# Patient Record
Sex: Female | Born: 1975 | Race: White | Hispanic: No | Marital: Single
Health system: Southern US, Community
[De-identification: ages and names within clinical notes are randomized; demographics above are authoritative.]

---

## 2009-05-05 ENCOUNTER — Ambulatory Visit: Payer: Self-pay | Admitting: Infectious Disease

## 2009-05-05 DIAGNOSIS — I10 Essential (primary) hypertension: Secondary | ICD-10-CM | POA: Insufficient documentation

## 2009-05-05 DIAGNOSIS — B20 Human immunodeficiency virus [HIV] disease: Secondary | ICD-10-CM

## 2009-05-05 LAB — CONVERTED CEMR LAB
ALT: 114 units/L — ABNORMAL HIGH (ref 0–35)
Alkaline Phosphatase: 90 units/L (ref 39–117)
Basophils Absolute: 0 10*3/uL (ref 0.0–0.1)
Basophils Relative: 1 % (ref 0–1)
CO2: 23 meq/L (ref 19–32)
Eosinophils Relative: 1 % (ref 0–5)
HCT: 40.8 % (ref 36.0–46.0)
HIV 1 RNA Quant: 1820000 copies/mL — ABNORMAL HIGH (ref <48)
HIV-1 RNA Quant, Log: 6.26 — ABNORMAL HIGH (ref <1.68)
HIV-1 antibody: POSITIVE — AB
Hemoglobin: 13.6 g/dL (ref 12.0–15.0)
Hep B Core Total Ab: NEGATIVE
Hep B S Ab: NEGATIVE
LDL Cholesterol: 97 mg/dL (ref 0–99)
MCHC: 33.3 g/dL (ref 30.0–36.0)
MCV: 90.7 fL (ref >=78.0)
Monocytes Absolute: 0.7 10*3/uL (ref 0.1–1.0)
Nitrite: NEGATIVE
Protein, ur: NEGATIVE mg/dL
RDW: 13.4 % (ref 11.5–15.5)
Sodium: 140 meq/L (ref 135–145)
Total Bilirubin: 0.3 mg/dL (ref 0.3–1.2)
Total Protein: 8 g/dL (ref 6.0–8.3)
Urine Glucose: NEGATIVE mg/dL
VLDL: 46 mg/dL — ABNORMAL HIGH (ref 0–40)

## 2009-05-24 ENCOUNTER — Ambulatory Visit: Payer: Self-pay | Admitting: Infectious Disease

## 2009-05-24 DIAGNOSIS — R32 Unspecified urinary incontinence: Secondary | ICD-10-CM

## 2009-05-24 DIAGNOSIS — B370 Candidal stomatitis: Secondary | ICD-10-CM | POA: Insufficient documentation

## 2009-05-24 DIAGNOSIS — K3189 Other diseases of stomach and duodenum: Secondary | ICD-10-CM

## 2009-05-24 DIAGNOSIS — R1013 Epigastric pain: Secondary | ICD-10-CM

## 2009-05-24 DIAGNOSIS — B373 Candidiasis of vulva and vagina: Secondary | ICD-10-CM

## 2009-05-24 DIAGNOSIS — M5430 Sciatica, unspecified side: Secondary | ICD-10-CM

## 2009-05-24 DIAGNOSIS — R159 Full incontinence of feces: Secondary | ICD-10-CM

## 2009-05-24 DIAGNOSIS — F319 Bipolar disorder, unspecified: Secondary | ICD-10-CM

## 2009-05-24 LAB — CONVERTED CEMR LAB
HIV 1 RNA Quant: 1010000 copies/mL — ABNORMAL HIGH (ref <48)
HIV-1 RNA Quant, Log: 6 — ABNORMAL HIGH (ref <1.68)

## 2009-06-14 ENCOUNTER — Encounter (INDEPENDENT_AMBULATORY_CARE_PROVIDER_SITE_OTHER): Payer: Self-pay | Admitting: *Deleted

## 2009-06-14 ENCOUNTER — Emergency Department (HOSPITAL_COMMUNITY): Admission: EM | Admit: 2009-06-14 | Discharge: 2009-06-15 | Payer: Self-pay | Admitting: Emergency Medicine

## 2009-06-27 ENCOUNTER — Encounter: Payer: Self-pay | Admitting: Licensed Clinical Social Worker

## 2009-06-27 ENCOUNTER — Ambulatory Visit: Payer: Self-pay | Admitting: Infectious Disease

## 2009-06-27 LAB — CONVERTED CEMR LAB
AST: 61 units/L — ABNORMAL HIGH (ref 0–37)
Albumin: 4.2 g/dL (ref 3.5–5.2)
Alkaline Phosphatase: 88 units/L (ref 39–117)
Basophils Absolute: 0.1 10*3/uL (ref 0.0–0.1)
Basophils Relative: 1 % (ref 0–1)
Beta hcg, urine, semiquantitative: NEGATIVE
Bilirubin, Direct: 0.1 mg/dL (ref 0.0–0.3)
Calcium: 8.8 mg/dL (ref 8.4–10.5)
Hemoglobin: 13.6 g/dL (ref 12.0–15.0)
Lymphocytes Relative: 56 % — ABNORMAL HIGH (ref 12–46)
MCHC: 33.2 g/dL (ref 30.0–36.0)
Neutro Abs: 2.5 10*3/uL (ref 1.7–7.7)
Neutrophils Relative %: 34 % — ABNORMAL LOW (ref 43–77)
Platelets: 158 10*3/uL (ref 150–400)
Potassium: 4.9 meq/L (ref 3.5–5.3)
RDW: 13.9 % (ref 11.5–15.5)
Sodium: 139 meq/L (ref 135–145)
Total Bilirubin: 0.3 mg/dL (ref 0.3–1.2)

## 2009-07-01 ENCOUNTER — Ambulatory Visit: Payer: Self-pay | Admitting: Infectious Disease

## 2009-07-01 LAB — CONVERTED CEMR LAB
BUN: 12 mg/dL (ref 6–23)
CO2: 25 meq/L (ref 19–32)
Chloride: 104 meq/L (ref 96–112)
Creatinine, Ser: 0.87 mg/dL (ref 0.40–1.20)
HDL: 19 mg/dL — ABNORMAL LOW (ref >39)
Potassium: 4.4 meq/L (ref 3.5–5.3)
Triglycerides: 687 mg/dL — ABNORMAL HIGH (ref <150)

## 2009-07-05 ENCOUNTER — Encounter: Payer: Self-pay | Admitting: Infectious Disease

## 2009-07-07 ENCOUNTER — Ambulatory Visit: Payer: Self-pay | Admitting: Infectious Disease

## 2009-07-08 ENCOUNTER — Encounter: Payer: Self-pay | Admitting: Infectious Disease

## 2009-07-08 LAB — CONVERTED CEMR LAB: Direct LDL: 50 mg/dL

## 2009-07-12 ENCOUNTER — Encounter: Payer: Self-pay | Admitting: Infectious Disease

## 2009-07-12 ENCOUNTER — Ambulatory Visit: Payer: Self-pay | Admitting: Internal Medicine

## 2009-07-12 LAB — CONVERTED CEMR LAB
Albumin: 4.5 g/dL (ref 3.5–5.2)
BUN: 16 mg/dL (ref 6–23)
Bilirubin Urine: NEGATIVE
CO2: 23 meq/L (ref 19–32)
Calcium: 9.3 mg/dL (ref 8.4–10.5)
Creatinine, Ser: 0.92 mg/dL (ref 0.40–1.20)
Glucose, Bld: 108 mg/dL — ABNORMAL HIGH (ref 70–99)
Ketones, urine, test strip: NEGATIVE
Nitrite: NEGATIVE
Phosphorus: 2.2 mg/dL — ABNORMAL LOW (ref 2.3–4.6)
Specific Gravity, Urine: <1.005
Total Bilirubin: 0.2 mg/dL — ABNORMAL LOW (ref 0.3–1.2)
Total Protein: 7.6 g/dL (ref 6.0–8.3)
Urobilinogen, UA: 0.2
pH: 5.5

## 2009-07-28 ENCOUNTER — Ambulatory Visit (HOSPITAL_COMMUNITY): Admission: RE | Admit: 2009-07-28 | Discharge: 2009-07-28 | Payer: Self-pay | Admitting: Infectious Disease

## 2009-07-28 ENCOUNTER — Ambulatory Visit: Payer: Self-pay | Admitting: Infectious Disease

## 2009-07-28 DIAGNOSIS — H6690 Otitis media, unspecified, unspecified ear: Secondary | ICD-10-CM | POA: Insufficient documentation

## 2009-07-28 DIAGNOSIS — J209 Acute bronchitis, unspecified: Secondary | ICD-10-CM | POA: Insufficient documentation

## 2009-07-28 DIAGNOSIS — R112 Nausea with vomiting, unspecified: Secondary | ICD-10-CM | POA: Insufficient documentation

## 2009-07-28 DIAGNOSIS — R05 Cough: Secondary | ICD-10-CM

## 2009-07-28 LAB — CONVERTED CEMR LAB
ALT: 43 units/L — ABNORMAL HIGH (ref 0–35)
Albumin: 4.6 g/dL (ref 3.5–5.2)
Basophils Relative: 0 % (ref 0–1)
Beta hcg, urine, semiquantitative: NEGATIVE
CO2: 28 meq/L (ref 19–32)
Hemoglobin: 14.7 g/dL (ref 12.0–15.0)
Lymphocytes Relative: 63 % — ABNORMAL HIGH (ref 12–46)
Lymphs Abs: 5.1 10*3/uL — ABNORMAL HIGH (ref 0.7–4.0)
MCHC: 34.4 g/dL (ref 30.0–36.0)
Monocytes Relative: 12 % (ref 3–12)
Neutro Abs: 2 10*3/uL (ref 1.7–7.7)
Neutrophils Relative %: 24 % — ABNORMAL LOW (ref 43–77)
Potassium: 4.3 meq/L (ref 3.5–5.3)
RBC: 4.74 M/uL (ref 3.87–5.11)
Sodium: 135 meq/L (ref 135–145)
Total Bilirubin: 0.5 mg/dL (ref 0.3–1.2)
Total Protein: 8.7 g/dL — ABNORMAL HIGH (ref 6.0–8.3)
WBC: 8 10*3/uL (ref 4.0–10.5)

## 2009-08-01 ENCOUNTER — Ambulatory Visit: Payer: Self-pay | Admitting: Infectious Disease

## 2009-08-01 ENCOUNTER — Encounter (INDEPENDENT_AMBULATORY_CARE_PROVIDER_SITE_OTHER): Payer: Self-pay | Admitting: *Deleted

## 2009-08-01 LAB — CONVERTED CEMR LAB: Pap Smear: NEGATIVE

## 2009-08-09 ENCOUNTER — Ambulatory Visit: Payer: Self-pay | Admitting: Infectious Disease

## 2009-08-09 LAB — CONVERTED CEMR LAB
ALT: 31 units/L (ref 0–35)
Alkaline Phosphatase: 72 units/L (ref 39–117)
CO2: 20 meq/L (ref 19–32)
Chloride: 102 meq/L (ref 96–112)
Creatinine, Ser: 0.85 mg/dL (ref 0.40–1.20)
Glucose, Bld: 113 mg/dL — ABNORMAL HIGH (ref 70–99)
Indirect Bilirubin: 0.3 mg/dL (ref 0.0–0.9)
Sodium: 134 meq/L — ABNORMAL LOW (ref 135–145)
Total Protein: 8.4 g/dL — ABNORMAL HIGH (ref 6.0–8.3)

## 2009-09-19 ENCOUNTER — Telehealth: Payer: Self-pay | Admitting: Infectious Disease

## 2009-09-21 ENCOUNTER — Telehealth (INDEPENDENT_AMBULATORY_CARE_PROVIDER_SITE_OTHER): Payer: Self-pay | Admitting: *Deleted

## 2009-10-19 ENCOUNTER — Telehealth (INDEPENDENT_AMBULATORY_CARE_PROVIDER_SITE_OTHER): Payer: Self-pay | Admitting: *Deleted

## 2009-11-11 ENCOUNTER — Telehealth (INDEPENDENT_AMBULATORY_CARE_PROVIDER_SITE_OTHER): Payer: Self-pay | Admitting: *Deleted

## 2009-12-15 ENCOUNTER — Telehealth (INDEPENDENT_AMBULATORY_CARE_PROVIDER_SITE_OTHER): Payer: Self-pay | Admitting: *Deleted

## 2010-01-19 ENCOUNTER — Telehealth (INDEPENDENT_AMBULATORY_CARE_PROVIDER_SITE_OTHER): Payer: Self-pay | Admitting: *Deleted

## 2010-02-10 ENCOUNTER — Encounter: Payer: Self-pay | Admitting: Infectious Disease

## 2010-02-13 ENCOUNTER — Ambulatory Visit: Payer: Self-pay | Admitting: Infectious Disease

## 2010-02-13 LAB — CONVERTED CEMR LAB
AST: 36 units/L (ref 0–37)
Alkaline Phosphatase: 76 units/L (ref 39–117)
BUN: 13 mg/dL (ref 6–23)
Basophils Relative: 1 % (ref 0–1)
Calcium: 9.6 mg/dL (ref 8.4–10.5)
Chloride: 103 meq/L (ref 96–112)
Creatinine, Ser: 0.78 mg/dL (ref 0.40–1.20)
Eosinophils Absolute: 0.1 10*3/uL (ref 0.0–0.7)
Eosinophils Relative: 1 % (ref 0–5)
HCT: 41.4 % (ref 36.0–46.0)
Hemoglobin: 13.9 g/dL (ref 12.0–15.0)
MCHC: 33.6 g/dL (ref 30.0–36.0)
MCV: 92.2 fL (ref 78.0–100.0)
Monocytes Absolute: 0.8 10*3/uL (ref 0.1–1.0)
Monocytes Relative: 10 % (ref 3–12)
RBC: 4.49 M/uL (ref 3.87–5.11)

## 2010-03-02 ENCOUNTER — Ambulatory Visit: Payer: Self-pay | Admitting: Infectious Disease

## 2010-03-16 ENCOUNTER — Telehealth (INDEPENDENT_AMBULATORY_CARE_PROVIDER_SITE_OTHER): Payer: Self-pay | Admitting: *Deleted

## 2010-04-17 ENCOUNTER — Encounter (INDEPENDENT_AMBULATORY_CARE_PROVIDER_SITE_OTHER): Payer: Self-pay | Admitting: *Deleted

## 2010-05-09 ENCOUNTER — Ambulatory Visit: Payer: Self-pay | Admitting: Infectious Disease

## 2010-05-31 ENCOUNTER — Ambulatory Visit: Admit: 2010-05-31 | Payer: Self-pay | Admitting: Infectious Disease

## 2010-06-27 NOTE — Progress Notes (Signed)
Summary: [facility] /pt assist meds arrived for May  Phone Note Refill Request      Prescriptions: FLUCONAZOLE 200 MG TABS (FLUCONAZOLE) 2 by mouth daily for 10 days  #20 x 4   Entered by:   Paulo Fruit  BS,CPht II,MPH   Authorized by:   Acey Lav MD   Signed by:   Paulo Fruit  BS,CPht II,MPH on 10/19/2009   Method used:   Samples Given   RxID:   [id number] NORVIR 100 MG TABS (RITONAVIR) Take 1 tablet by mouth once a day  #30 x 0   Entered by:   Paulo Fruit  BS,CPht II,MPH   Authorized by:   Acey Lav MD   Signed by:   Paulo Fruit  BS,CPht II,MPH on 10/19/2009   Method used:   Samples Given   RxID:   [id number]  Patient Assist Medication Verification: Medication name: Fluconazole 200mg  RX #  [id number] Tech approval:MLD   Patient Assist Medication Verification: Medication:Norvir 100mg  BMW#[id number] E Exp Date:01 Jun 2011 Tech approval:MLD  Nuriya has talked to Deirdre Evener, RN (Research nurser) who will be mailing Oluwadamilola her medications.  Kirstine will need to give [place] a contact number to be reached becuase on June 1 her medications will be coming through [facility] [place] ADAP program and they must speak to the patient before the initial shipment is sent. Paulo Fruit  BS,CPht II,MPH  Oct 19, 2009 8:43 AM

## 2010-06-27 NOTE — Assessment & Plan Note (Signed)
Summary: STUDY APPT/LH    Current Allergies: No known allergies  Social History: Tobacco Use:  no  Vital Signs:  Patient profile:   35 year old female Weight:      226.5 pounds (102.95 kg) BMI:     38.42 Temp:     97.0 degrees F oral Pulse rate:   88 / minute Resp:     24 per minute BP sitting:   136 / 101  (left arm) Is Patient Diabetic? No Pain Assessment Patient in pain? no      Nutritional Status BMI of > 30 = obese  Does patient need assistance? Functional Status Self care Ambulation Normal   Physical Exam  General:  alert and well-developed.   Mouth:  white plaque(s).  on side of tongue, red inflamed area in pharynx Lungs:  Normal respiratory effort, chest expands symmetrically. Lungs are clear to auscultation, no crackles or wheezes. Heart:  normal rate and regular rhythm.   Abdomen:  soft, non-tender, normal bowel sounds, and no hepatomegaly.   Extremities:  no pedal edema noted, palpable pulses  Neurologic:  sensation intact for >10 sec on bilat big toes Skin:  Intact without suspicious lesions or rashes Cervical Nodes:  No lymphadenopathy noted  Patient here for preentry visit. On exam patchy white plaque noted on sides of tongue, posterior throat area noted to be red and inflamed. She c/o vaginal discharge and itching. Also, continues to have very loose stools and sometimes incontinence of urine. Nose still a little tender. Entry planned for 2/15.  Deirdre Evener RN  July 01, 2009 10:48 AM    Complete Medication List: 1)  Fluconazole 100 Mg Tabs (Fluconazole) .... Take two tablets daily for 14 days 2)  Celexa 10 Mg Tabs (Citalopram hydrobromide) .... Take 1 tablet by mouth once a day 3)  Hydroxyzine Hcl 25 Mg Tabs (Hydroxyzine hcl) .... Take 1 tablet by mouth two times a day 4)  Bactrim Ds 800-160 Mg Tabs (Sulfamethoxazole-trimethoprim) .... Take 1 tablet by mouth once a day 5)  Neurontin 300 Mg Caps (Gabapentin) .... Take one tablet at bedtime then  increase to one tab two times a day  Other Orders: Est. Patient Research Study [phone]) T-Basic Metabolic Panel [id number]) T-Lipid Profile [id number]) Process Orders Check Orders Results:     [facility]: ABN not required for this insurance Tests Sent for requisitioning (July 01, 2009 11:25 AM):     07/01/2009: [facility] -- T-Basic Metabolic Panel [id number] (signed)     07/01/2009: [facility] -- T-Lipid Profile [id number] (signed)
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
## 2010-06-27 NOTE — Progress Notes (Signed)
Summary: Pt moved back to [place]--will need meds soon  Paula Mccarthy, Paula Mccarthy spoke [employer].  Paula Mccarthy is now back in [place] and will no longer be on ACTG Trial study.  She will now  need obtain all 042 medications via [facility]. She has an appointment scheduled with the provider here in [facility] and needs to meet with Paula Mccarthy to get re set up with [facility] so medications can be shipped. Paula Mccarthy  BS,CPht II,MPH  January 19, 2010 10:13 AM

## 2010-06-27 NOTE — Progress Notes (Signed)
Summary: [facility]AP/pt assist med arrived for Apr  Phone Note Refill Request      Prescriptions: NORVIR 100 MG TABS (RITONAVIR) Take 1 tablet by mouth once a day  #30 x 0   Entered by:   Paulo Fruit  BS,CPht II,MPH   Authorized by:   Acey Lav MD   Signed by:   Paulo Fruit  BS,CPht II,MPH on 09/21/2009   Method used:   Samples Given   RxID:   [id number]   Patient Assist Medication Verification: Medication: Norvir 100mg  Lot# [id number] E Exp Date:27 Jan 2011 Tech approval:MLD  Medication given to Deirdre Evener, RN who is mailing patient her medication.  Dr. Daiva Eves is aware. Paulo Fruit  BS,CPht II,MPH  September 21, 2009 12:30 PM

## 2010-06-27 NOTE — Assessment & Plan Note (Signed)
Summary: STUDY APPT/ LH    Current Allergies: No known allergies  Social History: Tobacco Use:  no   Complete Medication List: 1)  Fluconazole 100 Mg Tabs (Fluconazole) .... Take two tablets daily for 14 days 2)  Celexa 10 Mg Tabs (Citalopram hydrobromide) .... Take 1 tablet by mouth once a day 3)  Hydroxyzine Hcl 25 Mg Tabs (Hydroxyzine hcl) .... Take 1 tablet by mouth two times a day 4)  Bactrim Ds 800-160 Mg Tabs (Sulfamethoxazole-trimethoprim) .... Take 1 tablet by mouth once a day 5)  Neurontin 300 Mg Caps (Gabapentin) .... Take one tablet at bedtime then increase to one tab two times a day  Other Orders: T- * Misc. Laboratory test [phone]) Est. Patient Research Study [phone])  Process Orders Check Orders Results:     [facility]: ABN not required for this insurance Tests Sent for requisitioning (July 07, 2009 12:29 PM):     07/07/2009: [facility] -- T- * Misc. Laboratory test [phone] (signed)  Patient her for research visit to [facility] (direct).Deirdre Evener RN  July 07, 2009 10:54 AM    Appended Document: meds    Clinical Lists Changes  Medications: Added new medication of LOPID 600 MG TABS (GEMFIBROZIL) Take 1 tablet by mouth two times a day - Signed Added new medication of NORVIR 100 MG TABS (RITONAVIR) Take 1 tablet by mouth once a day - Signed Rx of LOPID 600 MG TABS (GEMFIBROZIL) Take 1 tablet by mouth two times a day;  #60 x 0;  Signed;  Entered by: Acey Lav MD;  Authorized by: Paulette Blanch Dam MD;  Method used: Print then Give to Patient Rx of NORVIR 100 MG TABS (RITONAVIR) Take 1 tablet by mouth once a day;  #31 x 11;  Signed;  Entered by: Acey Lav MD;  Authorized by: Paulette Blanch Dam MD;  Method used: Print then Give to Patient    Prescriptions: NORVIR 100 MG TABS (RITONAVIR) Take 1 tablet by mouth once a day  #31 x 11   Entered and Authorized by:   Acey Lav MD   Signed by:   Paulette Blanch Dam MD on 07/07/2009   Method used:   Print then Give to Patient   RxID:   [id number] LOPID 600 MG TABS (GEMFIBROZIL) Take 1 tablet by mouth two times a day  #60 x 0   Entered and Authorized by:   Acey Lav MD   Signed by:   Paulette Blanch Dam MD on 07/07/2009   Method used:   Print then Give to Patient   RxID:   [id number]

## 2010-06-27 NOTE — Assessment & Plan Note (Signed)
Summary: STUDY APPT/ LH    Current Allergies: No known allergies  Social History: Tobacco Use:  no  Vital Signs:  Patient profile:   35 year old female Weight:      226.2 pounds (102.82 kg) BMI:     38.37 Temp:     97.3 degrees F oral Pulse rate:   92 / minute Resp:     20 per minute BP sitting:   124 / 94  (left arm) Is Patient Diabetic? No Pain Assessment Patient in pain? no      Nutritional Status BMI of > 30 = obese  Does patient need assistance? Functional Status Self care Ambulation Normal   Patient randomized to Truvada, Darunavir and Ritonavir today. Her norvir will be delivered to her home tomorrow and she will start the medications then. The pharmacist, Gemma Payor instructed her on dosing, adherence, side effects, etc. All of her questions were answered. She continues to be plagued with vaginal yeast and thrush. Her last dose of diflucan is today. Discussed with Dr. Daiva Eves who prescribed fluconazole 400mg  daily for 10days. If this does not work, will consider another antifungal. Recommended she eat yogurt or take some kind of probiotic otc, cut back on carbohydrates also. Prescription given for lopid which she will wait and start in a few weeks. The pharmacist also recommended she try fish oil capsules in addition to the lopid for her high triglycerides. I will call her in 2 weeks if she doesn't call sooner for problems and she will return in 4 weeks for the next visit.Deirdre Evener RN  July 12, 2009 12:02 PM    Complete Medication List: 1)  Celexa 10 Mg Tabs (Citalopram hydrobromide) .... Take 1 tablet by mouth once a day 2)  Hydroxyzine Hcl 25 Mg Tabs (Hydroxyzine hcl) .... Take 1 tablet by mouth two times a day 3)  Bactrim Ds 800-160 Mg Tabs (Sulfamethoxazole-trimethoprim) .... Take 1 tablet by mouth once a day 4)  Neurontin 300 Mg Caps (Gabapentin) .... Take one tablet at bedtime then increase to one tab two times a day 5)  Lopid 600 Mg Tabs (Gemfibrozil)  .... Take 1 tablet by mouth two times a day 6)  Norvir 100 Mg Tabs (Ritonavir) .... Take 1 tablet by mouth once a day 7)  Fluconazole 200 Mg Tabs (Fluconazole) .... 2 by mouth daily for 10 days  Other Orders: Est. Patient Research Study [phone]) T-Basic Metabolic Panel [id number]) T-Hepatic Function [id number]) T-Phosphorus [id number]) T-Urine Pregnancy (in -house) [id number]) T-Urinalysis Dipstick only (32951OA) Prescriptions: FLUCONAZOLE 200 MG TABS (FLUCONAZOLE) 2 by mouth daily for 10 days  #20 x 0   Entered by:   Deirdre Evener RN   Authorized by:   Acey Lav MD   Signed by:   Deirdre Evener RN on 07/12/2009   Method used:   Historical   RxID:   [id number]   Process Orders Check Orders Results:     [facility]: ABN not required for this insurance Tests Sent for requisitioning (July 12, 2009 11:51 AM):     07/12/2009: [facility] -- T-Basic Metabolic Panel [id number] (signed)     07/12/2009: [facility] -- T-Hepatic Function [id number] (signed)     07/12/2009: [facility] -- T-Phosphorus [phone] (signed)    Laboratory Results   Urine Tests  Date/Time Recieved: 07/12/2009 0900 Date/Time Reported: 07/12/09 0910  Routine Urinalysis   Color: lt. yellow Appearance: Clear Glucose: negative   (Normal Range: Negative) Bilirubin: negative   (  Normal Range: Negative) Ketone: negative   (Normal Range: Negative) Spec. Gravity: <1.005   (Normal Range: 1.003-1.035) Blood: negative   (Normal Range: Negative) pH: 5.5   (Normal Range: 5.0-8.0) Protein: negative   (Normal Range: Negative) Urobilinogen: 0.2   (Normal Range: 0-1) Nitrite: negative   (Normal Range: Negative) Leukocyte Esterace: trace   (Normal Range: Negative)    Urine HCG: negative Comments: Urine Specific Gravity 1.016 Vickie Maxine Glenn  July 12, 2009 9:19 AM     Laboratory Results   Urine Tests    Routine  Urinalysis   Color: lt. yellow Appearance: Clear Glucose: negative   (Normal Range: Negative) Bilirubin: negative   (Normal Range: Negative) Ketone: negative   (Normal Range: Negative) Spec. Gravity: <1.005   (Normal Range: 1.003-1.035) Blood: negative   (Normal Range: Negative) pH: 5.5   (Normal Range: 5.0-8.0) Protein: negative   (Normal Range: Negative) Urobilinogen: 0.2   (Normal Range: 0-1) Nitrite: negative   (Normal Range: Negative) Leukocyte Esterace: trace   (Normal Range: Negative)    Urine HCG: negative Comments: Urine Specific Gravity 1.016 Vickie Maxine Glenn  July 12, 2009 9:19 AM
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
## 2010-06-27 NOTE — Progress Notes (Signed)
Summary: [place]/pt assist meds arrived for Jun via [facility] ADAP  Phone Note Refill Request      Prescriptions: FLUCONAZOLE 200 MG TABS (FLUCONAZOLE) 2 by mouth daily for 10 days  #20 x 0   Entered by:   Paulo Fruit  BS,CPht II,MPH   Authorized by:   Acey Lav MD   Signed by:   Paulo Fruit  BS,CPht II,MPH on 11/11/2009   Method used:   Samples Given   RxID:   [id number] NORVIR 100 MG TABS (RITONAVIR) Take 1 tablet by mouth once a day  #30 x 0   Entered by:   Paulo Fruit  BS,CPht II,MPH   Authorized by:   Acey Lav MD   Signed by:   Paulo Fruit  BS,CPht II,MPH on 11/11/2009   Method used:   Samples Given   RxID:   [id number]  Patient Assist Medication Verification: Medication name:Norvir 100mg  RX # [id number] Tech approval:MLD  Patient Assist Medication Verification: Medication name:Fluconazole 200mg  RX # [id number] Tech approval:MLD  Patient's medication arrived here in office.  Deirdre Evener, RN Research nurse will mail patient her medications. Paulo Fruit  BS,CPht II,MPH  November 11, 2009 10:54 AM

## 2010-06-27 NOTE — Progress Notes (Signed)
Summary: Pt. wanting to apply for ADAP  Phone Note Call from Patient Call back at Home Phone [phone]   Caller: Patient Reason for Call: Talk to Nurse Summary of Call: Pt. calling to talk with R. Roseanne Reno about her ADAP application.  Mr. Roseanne Reno is out-of-the-office until Oct. 26, 2011.  Pt. given tel. # for Kandice Robinsons to call for an appt. Jennet Maduro RN  March 16, 2010 3:27 PM

## 2010-06-27 NOTE — Progress Notes (Signed)
Summary: [place]/pt assist meds arrived for Jul  Phone Note Refill Request      Prescriptions: NORVIR 100 MG TABS (RITONAVIR) Take 1 tablet by mouth once a day  #30 x 0   Entered by:   Paulo Fruit  BS,CPht II,MPH   Authorized by:   Acey Lav MD   Signed by:   Paulo Fruit  BS,CPht II,MPH on 12/15/2009   Method used:   Samples Given   RxID:   [id number] FLUCONAZOLE 200 MG TABS (FLUCONAZOLE) 2 by mouth daily for 10 days  #20 x 0   Entered by:   Paulo Fruit  BS,CPht II,MPH   Authorized by:   Acey Lav MD   Signed by:   Paulo Fruit  BS,CPht II,MPH on 12/15/2009   Method used:   Samples Given   RxID:   [id number]  Patient Assist Medication Verification: Medication name: Fluconazole 200mg  RX # [id number] Tech approval:MLD  Patient Assist Medication Verification: Medication name:Norvir 100mg  RX # [id number] Tech approval:MLD  Medication given to Deirdre Evener, RN (ID Research) to mail to patient. Paulo Fruit  BS,CPht II,MPH  December 15, 2009 3:19 PM

## 2010-06-27 NOTE — Miscellaneous (Signed)
Summary: HIV-1 RNA, CD4 (RESEARCH)  Clinical Lists Changes  Observations: Added new observation of HIV1RNA QA: 229,798 (07/12/2009 11:51)

## 2010-06-27 NOTE — Miscellaneous (Signed)
Summary: Orders Update - labs  Clinical Lists Changes  Orders: Added new Test order of T-CBC w/Diff [phone]) - Signed Added new Test order of T-CD4SP [facility]) (CD4SP) - Signed Added new Test order of T-Comprehensive Metabolic Panel [phone]) - Signed Added new Test order of T-HIV Viral Load [id number]) - Signed     Process Orders Check Orders Results:     [facility]: ABN not required for this insurance Order queued for requisitioning for [facility]: February 10, 2010 2:41 PM  Tests Sent for requisitioning (February 10, 2010 2:41 PM):     02/13/2010: [facility] -- T-CBC w/Diff [[id number]] (signed)     02/13/2010: [facility] -- T-Comprehensive Metabolic Panel [[id number]] (signed)     02/13/2010: [facility] -- T-HIV Viral Load [phone] (signed)

## 2010-06-27 NOTE — Assessment & Plan Note (Signed)
Summary: F/U [MKJ]   Visit Type:  Follow-up Referring Provider:  [facility] Primary Provider:  Paulette Blanch Dam MD  CC:  follow-up visit.  History of Present Illness: 35 yo with HIV, dx in 2002 after was found to have trichomonas.  She had been followed largely by prison MDs. She has entered ACTG 5257 and randomized to darunavir, norvir and truvada. Unfortunately she left town frequently and did not show for study reliiably and has beeen taken off of study. Her cd4 is still healthy above 500 but her VL has rebounded to nearly 500K copies. SHe has felt relatively well other than sensation of a staple from prior surgery begining tow work its way out of her right buttocks. She is living with a friend and feels her mood is stable. Over 45 minutes spent on this pt incluidng >50% of time spent face to face counsellling of pt.      Problems Prior to Update: 1)  Screening For Malignant Neoplasm of The Cervix  (ICD-V76.2) 2)  Otitis Media, Left  (ICD-382.9) 3)  Acute Bronchitis  (ICD-466.0) 4)  Cough  (ICD-786.2) 5)  Nausea and Vomiting  (ICD-787.01) 6)  Inadequate Material Resources  (ICD-V60.2) 7)  Legal Circumstance  (ICD-V62.5) 8)  Dyspepsia  (ICD-536.8) 9)  Candidiasis, Vaginal  (ICD-112.1) 10)  Candidiasis, Oral  (ICD-112.0) 11)  Full Incontinence of Feces  (ICD-787.60) 12)  Bipolar Disorder Unspecified  (ICD-296.80) 13)  Unspecified Urinary Incontinence  (ICD-788.30) 14)  Sciatica, Acute  (ICD-724.3) 15)  Hypertension  (ICD-401.9) 16)  HIV Infection  (ICD-042)  Medications Prior to Update: 1)  Celexa 10 Mg Tabs (Citalopram Hydrobromide) .... Take 1 Tablet By Mouth Once A Day 2)  Hydroxyzine Hcl 25 Mg Tabs (Hydroxyzine Hcl) .... Take 1 Tablet By Mouth Two Times A Day 3)  Neurontin 300 Mg Caps (Gabapentin) .... Take One Tablet At Bedtime Then Increase To One Tab Two Times A Day 4)  Lopid 600 Mg Tabs (Gemfibrozil) .... Take 1 Tablet By Mouth Two Times A Day 5)  Norvir  100 Mg Tabs (Ritonavir) .... Take 1 Tablet By Mouth Once A Day 6)  Fluconazole 200 Mg Tabs (Fluconazole) .... 2 By Mouth Daily For 10 Days 7)  Promethazine Hcl 25 Mg Tabs (Promethazine Hcl) .... One To Two Tablets Every 6 Hours As Needed For Nausea 8)  Zithromax 250 Mg Tabs (Azithromycin) .... Take Two Tablets On First Day Then One Tablet A Day For 4 Days 9)  Prednisone 20 Mg Tabs (Prednisone) .... Take Three Tablets A Day For 7 Days 10)  Ventolin Hfa 108 (90 Base) Mcg/act Aers (Albuterol Sulfate) .... Two Puffs Four Times A Day As Needed For Cough Wheezing 11)  Prezista 400 Mg Tabs (Darunavir Ethanolate) .... Two Tabs Daily With Norvir and Truvada Via Study 12)  Truvada 200-300 Mg Tabs (Emtricitabine-Tenofovir) .... Take 1 Tablet By Mouth Once A Day With Prezista and Norvir (Via Study) 13)  Metronidazole 500 Mg Tabs (Metronidazole) .... Take 4 Tabs By Mouth X 1  Current Medications (verified): 1)  Lopid 600 Mg Tabs (Gemfibrozil) .... Take 1 Tablet By Mouth Two Times A Day 2)  Norvir 100 Mg Tabs (Ritonavir) .... Take 1 Tablet By Mouth Once A Day 3)  Fluconazole 200 Mg Tabs (Fluconazole) .... 2 By Mouth Daily For 10 Days 4)  Promethazine Hcl 25 Mg Tabs (Promethazine Hcl) .... One To Two Tablets Every 6 Hours As Needed For Nausea 5)  Ventolin Hfa 108 (90 Base) Mcg/act Aers (  Albuterol Sulfate) .... Two Puffs Four Times A Day As Needed For Cough Wheezing 6)  Prezista 400 Mg Tabs (Darunavir Ethanolate) .... Two Tabs Daily With Norvir and Truvada Via Study 7)  Truvada 200-300 Mg Tabs (Emtricitabine-Tenofovir) .... Take 1 Tablet By Mouth Once A Day With Prezista and Norvir (Via Study)  Allergies (verified): No Known Drug Allergies   Preventive Screening-Counseling & Management  Alcohol-Tobacco     Alcohol drinks/day: no     Alcohol type: beer     Smoking Status: current     Smoking Cessation Counseling: yes     Smoke Cessation Stage: contemplative     Packs/Day:  0.75  Caffeine-Diet-Exercise     Caffeine use/day: coffee, tea and sodas     Does Patient Exercise: yes     Type of exercise: walking     Exercise (avg: min/session): >60     Times/week: 3  Safety-Violence-Falls     Seat Belt Use: yes   Current Allergies (reviewed today): No known allergies  Past History:  Past Medical History: Last updated: 06/27/2009 suicide attempt at 16 with pills again in 2002 with pills. HIV Bipolar disorder INcarcerated Thrush Torn ligaments in knee left Fractured nose  Past Surgical History: Last updated: 05/24/2009 none  Family History: Last updated: 06/27/2009 noncontributory  Social History: Last updated: 05/24/2009 current tobacco and marijuana use, not regularly alcohol use. Not sexually active at present.  Risk Factors: Alcohol Use: no (03/02/2010) Caffeine Use: coffee, tea and sodas (03/02/2010) Exercise: yes (03/02/2010)  Risk Factors: Smoking Status: current (03/02/2010) Packs/Day: 0.75 (03/02/2010)  Family History: Reviewed history from 06/27/2009 and no changes required. noncontributory  Social History: Reviewed history from 05/24/2009 and no changes required. current tobacco and marijuana use, not regularly alcohol use. Not sexually active at present.  Review of Systems       The patient complains of suspicious skin lesions.  The patient denies anorexia, fever, weight loss, weight gain, vision loss, decreased hearing, hoarseness, chest pain, syncope, dyspnea on exertion, peripheral edema, prolonged cough, headaches, hemoptysis, abdominal pain, melena, hematochezia, severe indigestion/heartburn, hematuria, incontinence, genital sores, muscle weakness, transient blindness, difficulty walking, depression, unusual weight change, abnormal bleeding, and enlarged lymph nodes.    Vital Signs:  Patient profile:   35 year old female Menstrual status:  regular Height:      64.5 inches (163.83 cm) Weight:      239.8 pounds  (109 kg) BMI:     40.67 Temp:     98.7 degrees F (37.06 degrees C) oral Pulse rate:   116 / minute BP sitting:   162 / 91  (right arm)  Vitals Entered By: Baxter Hire) (March 02, 2010 3:32 PM) CC: follow-up visit Pain Assessment Patient in pain? no      Nutritional Status Detail appetite is good per patient  Have you ever been in a relationship where you felt threatened, hurt or afraid?No   Does patient need assistance? Functional Status Self care Ambulation Normal Comments off medication for the past 3-4 months.    Physical Exam  General:  alert and overweight-appearing.   Head:  normocephalic, atraumatic, and no abnormalities observed.   Eyes:  vision grossly intact, pupils equal, and pupils round.   Ears:  no external deformities and ear piercing(s) noted.   Nose:  no external deformity and no external erythema.   Mouth:  pharynx pink and moist, no erythema, and no exudates.   Neck:  supple and full ROM.   Lungs:  normal respiratory effort, no crackles, and no wheezes.   Heart:  normal rate and regular rhythm.   Abdomen:  soft, non-tender, normal bowel sounds, and no hepatomegaly.   Msk:  normal ROM and no joint deformities.   Neurologic:  alert & oriented X3 and gait normal.   Skin:  she has area on right buttocks which is indurated where there does seem to be palpable foregin body underneath skin Psych:  Oriented X3, memory intact for recent and remote, and normally interactive.          Medication Adherence: 03/02/2010   Adherence to medications reviewed with patient. Counseling to provide adequate adherence provided                                Impression & Recommendations:  Problem # 1:  HIV INFECTION (ICD-042) I hope she can finally be adherent to ARV regimen. I will put her on same one she was on as part of ACTG 5257 The following medications were removed from the medication list:    Zithromax 250 Mg Tabs (Azithromycin) .Marland Kitchen... Take two  tablets on first day then one tablet a day for 4 days    Metronidazole 500 Mg Tabs (Metronidazole) .Marland Kitchen... Take 4 tabs by mouth x 1 Her updated medication list for this problem includes:    Fluconazole 200 Mg Tabs (Fluconazole) .Marland Kitchen... 2 by mouth daily for 10 days  Orders: Est. Patient Level V (99215)Future Orders: T-CD4SP ([facility]) (CD4SP) ... 04/03/2010 T-HIV Viral Load [id number]) ... 04/03/2010 T-CBC w/Diff ([id number]) ... 04/03/2010 T-Comprehensive Metabolic Panel [id number]) ... 04/03/2010 T-RPR (Syphilis) [id number]) ... 04/03/2010 T-Lipid Profile [phone]) ... 04/03/2010  Problem # 2:  ENCOUNTER FOR REMOVAL OF SUTURES (ICD-V58.32) I have offered to refer her to general surgeon for removal of staple. She wishes to wait for now Orders: Est. Patient Level V (76160)  Problem # 3:  NAUSEA AND VOMITING (ICD-787.01) antiemetics for when she starts her meds Orders: Est. Patient Level V (73710)  Problem # 4:  HYPERTENSION (ICD-401.9) will need bp med, part of this is white coat htn BP today: 162/91 Prior BP: 116/72 (08/09/2009)  Labs Reviewed: K+: 3.9 (02/13/2010) Creat: : 0.78 (02/13/2010)   Chol: 171 (07/01/2009)   HDL: 19 (07/01/2009)   LDL: * mg/dL (62/69/4854)   TG: 627 (07/01/2009)  Problem # 5:  CANDIDIASIS OF VULVA AND VAGINA (ICD-112.1) diflucan Her updated medication list for this problem includes:    Fluconazole 200 Mg Tabs (Fluconazole) .Marland Kitchen... 2 by mouth daily for 10 days  Other Orders: Influenza Vaccine NON MCR (03500) Hepatitis A Vaccine (Adult Dose) (93818) Admin 1st Vaccine (29937)  Patient Instructions: 1)  meet with Rob today re getting your medcines from [facility] 2)  fu appt in December 3)  Advised not to eat any food or drink any liquids after 10 PM the night before procedure. 4)  Be sure to return for lab work one (1) week before your next appointment as scheduled.    Immunizations Administered:  Hepatitis A Vaccine # 2:    Vaccine Type:  HepA    Site: left deltoid    Mfr: Havrix    Dose: 1.0 ml    Route: IM    Given by: Kathi Simpers CMA(AAMA)    Exp. Date: 05/10/2012    Lot #: [id number]    VIS given: 08/15/04 version given March 02, 2010.  Not Administered:    Hepatitis B  Vaccine # 3 not given Prescriptions: PROMETHAZINE HCL 25 MG TABS (PROMETHAZINE HCL) one to two tablets every 6 hours as needed for nausea  #60 x 11   Entered and Authorized by:   Acey Lav MD   Signed by:   Paulette Blanch Dam MD on 03/02/2010   Method used:   Print then Give to Patient   RxID:   [id number] FLUCONAZOLE 200 MG TABS (FLUCONAZOLE) 2 by mouth daily for 10 days  #20 x 4   Entered and Authorized by:   Acey Lav MD   Signed by:   Paulette Blanch Dam MD on 03/02/2010   Method used:   Print then Give to Patient   RxID:   [id number] LOPID 600 MG TABS (GEMFIBROZIL) Take 1 tablet by mouth two times a day  #60 x 11   Entered and Authorized by:   Acey Lav MD   Signed by:   Paulette Blanch Dam MD on 03/02/2010   Method used:   Print then Give to Patient   RxID:   [id number] NORVIR 100 MG TABS (RITONAVIR) Take 1 tablet by mouth once a day  #30 x 11   Entered and Authorized by:   Acey Lav MD   Signed by:   Paulette Blanch Dam MD on 03/02/2010   Method used:   Print then Give to Patient   RxID:   [id number] TRUVADA 200-300 MG TABS (EMTRICITABINE-TENOFOVIR) Take 1 tablet by mouth once a day with prezista and norvir (via study)  #30 x 11   Entered and Authorized by:   Acey Lav MD   Signed by:   Paulette Blanch Dam MD on 03/02/2010   Method used:   Print then Give to Patient   RxID:   [id number] PREZISTA 400 MG TABS (DARUNAVIR ETHANOLATE) two tabs daily with norvir and truvada via study  #60 x 11   Entered and Authorized by:   Acey Lav MD   Signed by:   Paulette Blanch Dam MD on 03/02/2010   Method used:   Print then Give to Patient   RxID:   [id number]    Influenza  Vaccine    Vaccine Type: Fluvax Non-MCR    Site: right deltoid    Mfr: Novartis    Dose: 0.5 ml    Route: IM    Given by: Baxter Hire)    Exp. Date: 08/27/2010    Lot #: [id number]    VIS given: 12/20/09 version given March 02, 2010.  Flu Vaccine Consent Questions    Do you have a history of severe allergic reactions to this vaccine? no    Any prior history of allergic reactions to egg and/or gelatin? no    Do you have a sensitivity to the preservative Thimersol? no    Do you have a past history of Guillan-Barre Syndrome? no    Do you currently have an acute febrile illness? no    Have you ever had a severe reaction to latex? no    Vaccine information given and explained to patient? yes    Are you currently pregnant? no

## 2010-06-27 NOTE — Miscellaneous (Signed)
Summary: HIV-1 RNA, CD4 (RESEARCH)  Clinical Lists Changes  Observations: Added new observation of CD4 COUNT: 669 microliters (08/09/2009 11:53) Added new observation of HIV1RNA QA: 966 copies/mL (08/09/2009 11:53)

## 2010-06-27 NOTE — Miscellaneous (Signed)
Summary: clinical update/ryan white [place] approved til 08/26/10  Clinical Lists Changes  Observations: Added new observation of AIDSDAP: Yes 2011 (06/14/2009 11:52)

## 2010-06-27 NOTE — Miscellaneous (Signed)
Summary:  CD4 (RESEARCH)  Clinical Lists Changes  Observations: Added new observation of CD4 COUNT: 325 microliters (07/12/2009 8:35)

## 2010-06-27 NOTE — Progress Notes (Signed)
  Phone Note Call from Patient Call back at [phone]   Caller: Patient Reason for Call: Talk to Nurse Summary of Call: Sira called to say she was in [place] right now working and would be traveling around [employer]. She missed her week 8 study appt and is concerned about her medications. I told her we would send her her meds for now, but at the 3rd missed study visit, I would have to take her off study. She said she would try to get back here for followup and would call when she was able. I have asked Byrd Hesselbach to have her norvir sent here to the clinic and I will make sure it gets sent to her along with the study meds. Initial call taken by: Deirdre Evener RN,  September 19, 2009 10:35 AM     Appended Document:  thanks Selena Batten. I am sorry she turned out to be a poor study candidate

## 2010-06-27 NOTE — Miscellaneous (Signed)
  Clinical Lists Changes  Observations: Added new observation of YEARAIDSPOS: 2002  (04/17/2010 11:32)
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
## 2010-06-27 NOTE — Miscellaneous (Signed)
Summary: Problem List update  Clinical Lists Changes  Problems: Added new problem of SCREENING FOR MALIGNANT NEOPLASM OF THE CERVIX (ICD-V76.2) 

## 2010-06-27 NOTE — Miscellaneous (Signed)
Summary: [facility]  [facility]   Imported By: Florinda Marker 07/05/2009 15:20:35  _____________________________________________________________________  External Attachment:    Type:   Image     Comment:   External Document
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
## 2010-06-27 NOTE — Miscellaneous (Signed)
Summary: HIV-1 RNA, CD4 (RESEARCH)  Clinical Lists Changes  Observations: Added new observation of CD4 COUNT: 320 microliters (07/01/2009 8:27) Added new observation of HIV1RNA QA: 534,119 copies/mL (07/01/2009 8:27)

## 2010-06-27 NOTE — Assessment & Plan Note (Signed)
Summary: STUDY APPT/ [facility]    Current Allergies: No known allergies  Vital Signs:  Patient profile:   35 year old female Menstrual status:  regular Weight:      220.1 pounds (100.05 kg) BMI:     37.33 Temp:     97.1 degrees F oral Pulse rate:   92 / minute Resp:     18 per minute BP sitting:   116 / 72  (left arm) Is Patient Diabetic? No Pain Assessment Patient in pain? no      Nutritional Status BMI of > 30 = obese  Does patient need assistance? Functional Status Self care Ambulation Normal   Patient here for week 4 study visit. She does feel better than when she was here 2 weeks ago. Her ear pain and wheezing have resolved. She has some thrush on the sides of her tongue though which she says has recently come back. She denies any vaginal discharge or itching. Still has some am nausea and vomiting, diarrhea. Discussed with Dr. 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 Called metronidazole into [facility] on [address] She will return in 4 weeks.Deirdre Evener RN  August 09, 2009 10:31 AM    Medications Added to Medication List This Visit: 1)  Metronidazole 500 Mg Tabs (Metronidazole) .... Take 4 tabs by mouth x 1  Other Orders: Est. Patient Research Study [phone]) T-Basic Metabolic Panel [phone] [id number]) T-Hepatic Function [id number]) T-Phosphorus ([phone]) Prescriptions: FLUCONAZOLE 200 MG TABS (FLUCONAZOLE) 2 by mouth daily for 10 days  #20 x 4   Entered by:   Deirdre Evener RN   Authorized by:   Acey Lav MD   Signed by:   Deirdre Evener RN on 08/09/2009   Method used:   Telephoned to ...         RxID:   [id number]  Process Orders Check Orders Results:     [facility]: ABN not required for this insurance Order queued for requisitioning for [facility]: August 09, 2009 9:11 AM  Tests Sent for requisitioning (August 09, 2009 9:11 AM):     08/09/2009:  [facility] -- T-Basic Metabolic Panel [phone] (signed)     08/09/2009: [facility] -- T-Hepatic Function [phone] (signed)     08/09/2009: [facility] -- T-Phosphorus [phone] (signed)

## 2010-06-27 NOTE — Assessment & Plan Note (Signed)
Summary: PAP SMEAR/VS   Vitals Entered By: Jennet Maduro RN (August 01, 2009 2:28 PM) CC: PAP smear visit.  Pt. given condoms.  Pt. given educational materials re:  HIV and women, exercise, diet, nutrition, BSE and Self-esteem.   Evaluation and Follow-Up  Prevention For Positives: 08/01/2009   Safe sex practices discussed with patient. Condoms offered. Prior Medications: CELEXA 10 MG TABS (CITALOPRAM HYDROBROMIDE) Take 1 tablet by mouth once a day HYDROXYZINE HCL 25 MG TABS (HYDROXYZINE HCL) Take 1 tablet by mouth two times a day NEURONTIN 300 MG CAPS (GABAPENTIN) take one tablet at bedtime then increase to one tab two times a day LOPID 600 MG TABS (GEMFIBROZIL) Take 1 tablet by mouth two times a day NORVIR 100 MG TABS (RITONAVIR) Take 1 tablet by mouth once a day FLUCONAZOLE 200 MG TABS (FLUCONAZOLE) 2 by mouth daily for 10 days PROMETHAZINE HCL 25 MG TABS (PROMETHAZINE HCL) one to two tablets every 6 hours as needed for nausea ZITHROMAX 250 MG TABS (AZITHROMYCIN) take two tablets on first day then one tablet a day for 4 days PREDNISONE 20 MG TABS (PREDNISONE) take three tablets a day for 7 days VENTOLIN HFA 108 (90 BASE) MCG/ACT AERS (ALBUTEROL SULFATE) two puffs four times a day as needed for cough wheezing PREZISTA 400 MG TABS (DARUNAVIR ETHANOLATE) two tabs daily with norvir and truvada via study TRUVADA 200-300 MG TABS (EMTRICITABINE-TENOFOVIR) Take 1 tablet by mouth once a day with prezista and norvir (via study) Current Allergies: No known allergies  Orders Added: 1)  Est. Patient Level I [03474] 2)  T-PAP [facility]) [25956]            Prevention For Positives: 08/01/2009   Safe sex practices discussed with patient. Condoms offered.

## 2010-08-10 LAB — T-HELPER CELL (CD4) - (RCID CLINIC ONLY)
CD4 % Helper T Cell: 12 % — ABNORMAL LOW (ref 33–55)
CD4 T Cell Abs: 550 uL (ref 400–2700)

## 2010-08-29 LAB — T-HELPER CELL (CD4) - (RCID CLINIC ONLY): CD4 T Cell Abs: 330 uL — ABNORMAL LOW (ref 400–2700)
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
## 2011-02-07 IMAGING — CR DG CHEST 2V
2 series · 2 of 2 positions shown · non-contrast
Comparison: None.

CLINICAL DATA: Cough.  Fever.  HIV positive.  Smoker.

CHEST - 2 VIEW

[w chest pa]
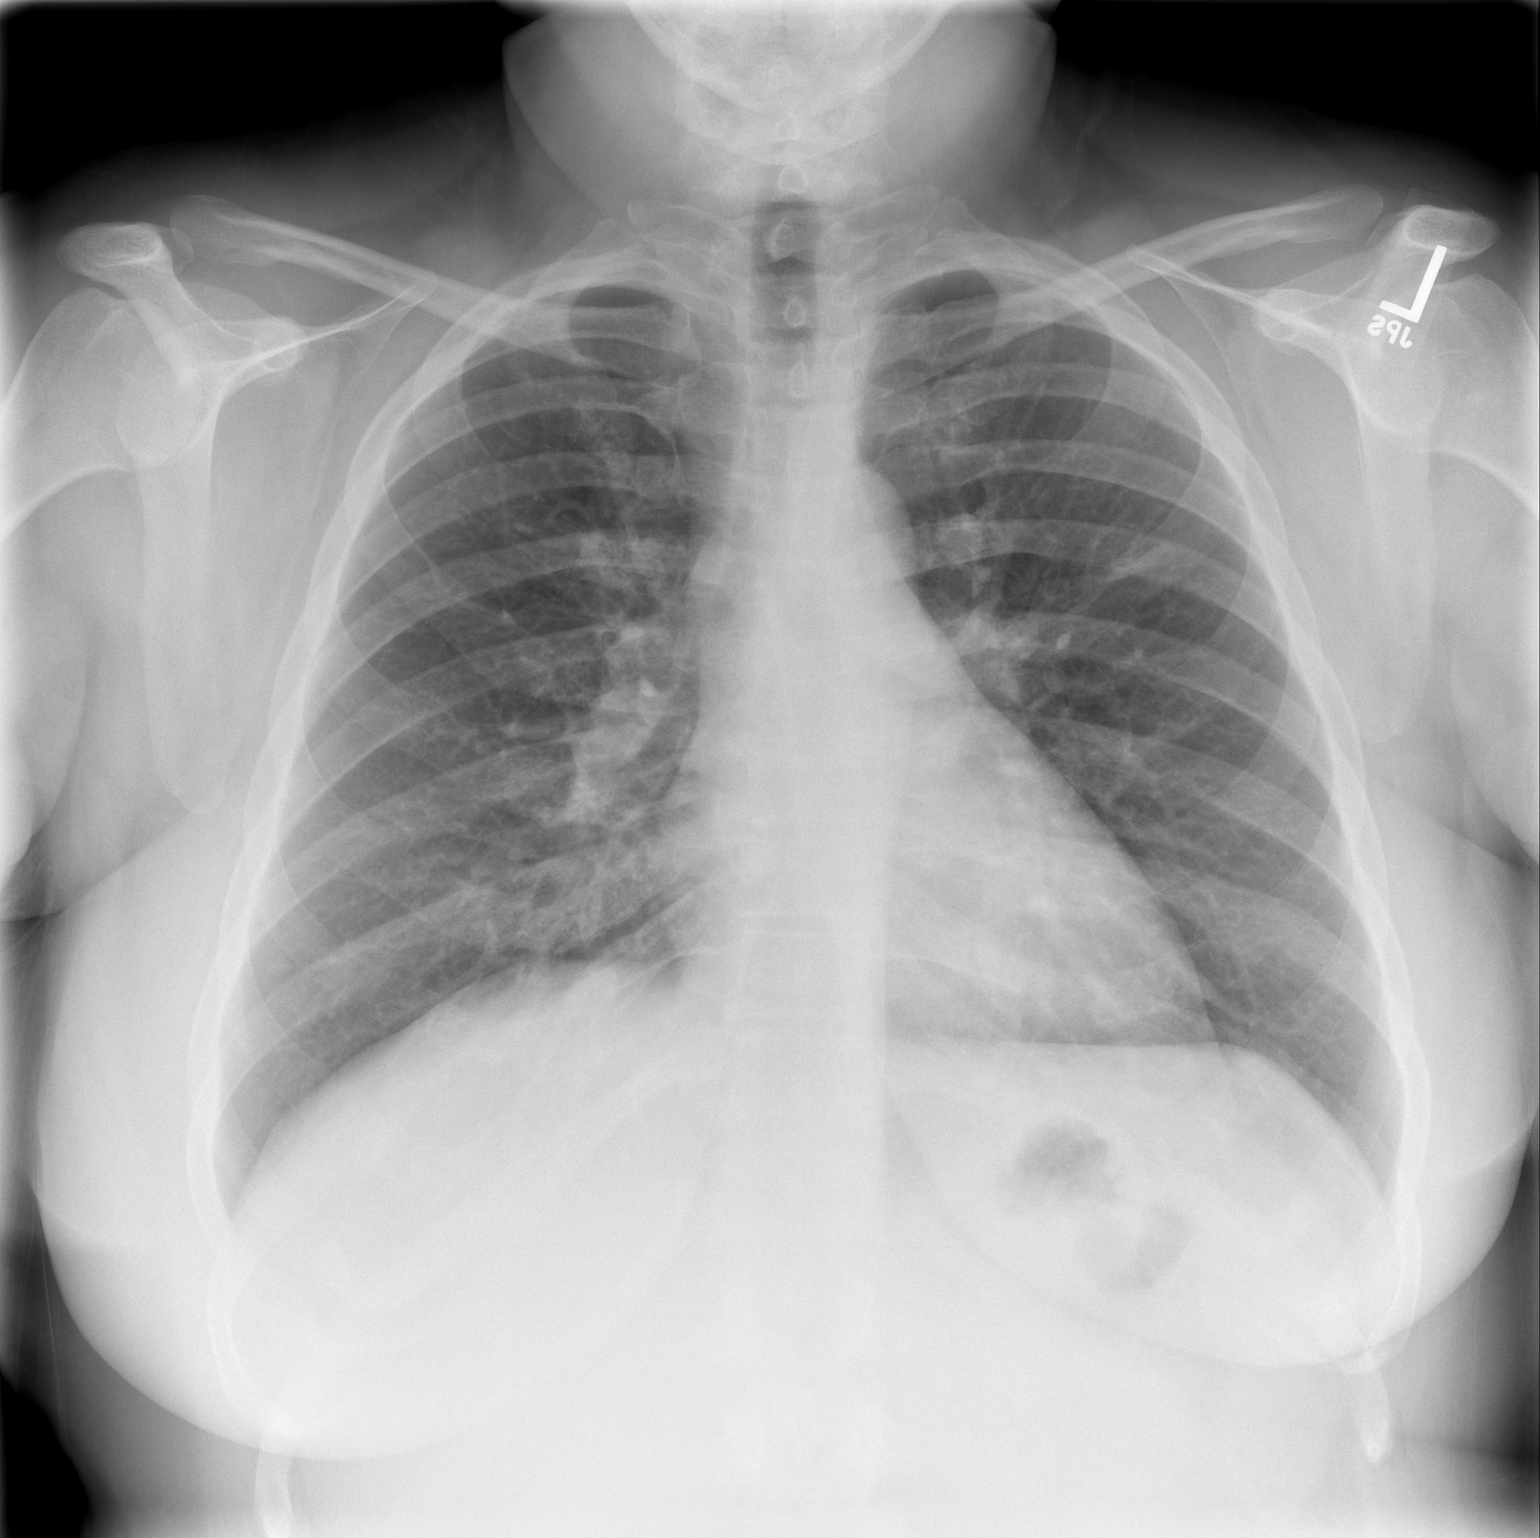

[w chest lat]
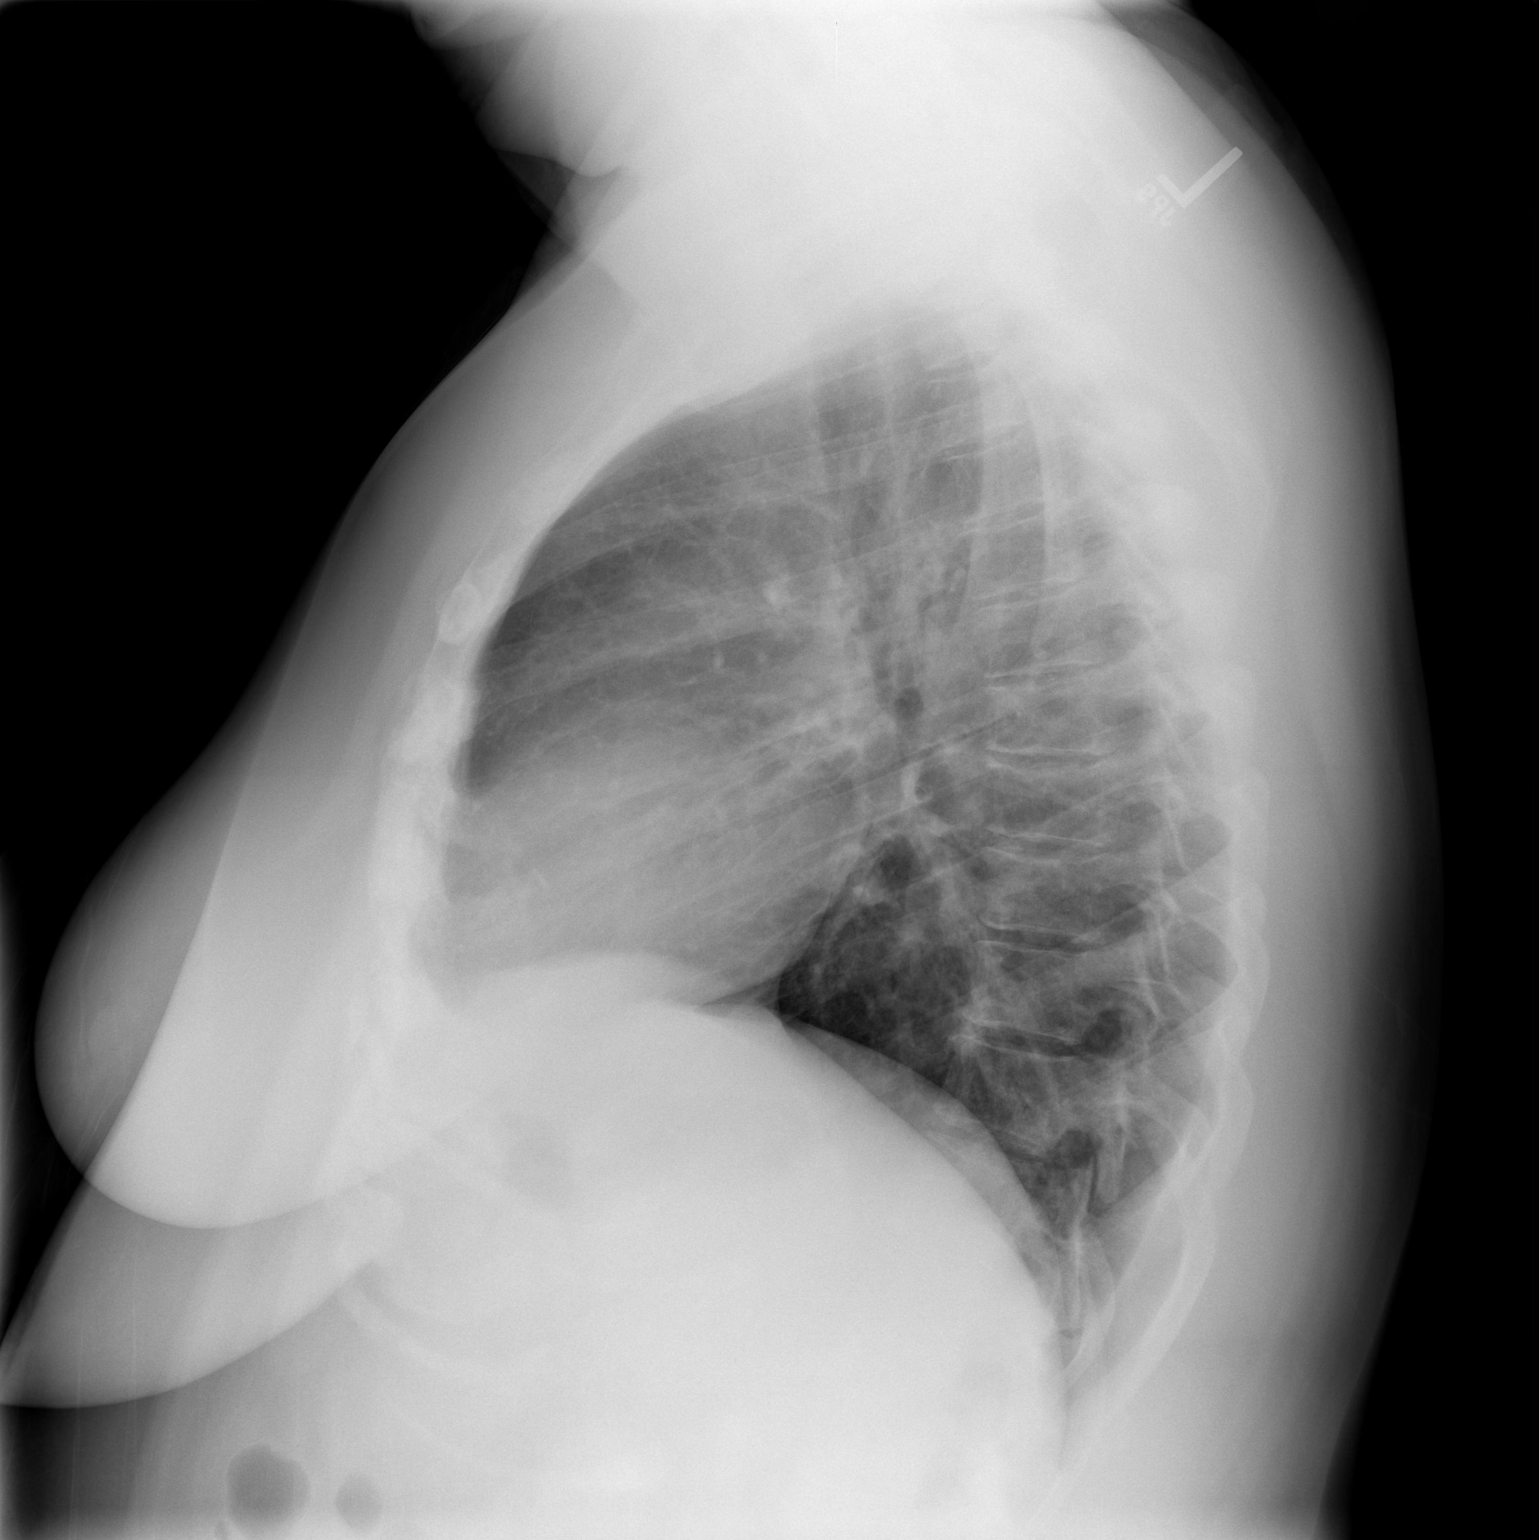

[2 of 2 positions shown; findings below may reference images not displayed]

FINDINGS: Linear opacities seen in the left upper lobe, consistent
with mild scarring or atelectasis.  There is no evidence of
pulmonary consolidation or pleural effusion.  Heart size and
mediastinal contours are normal.
IMPRESSION: Mild left upper lobe scarring or atelectasis.  No evidence of
pulmonary consolidation or effusion.

## 2018-05-29 NOTE — Progress Notes (Signed)
errpr

## 2019-08-27 DEATH — deceased
